# Patient Record
Sex: Male | Born: 1995 | Race: Black or African American | Hispanic: No | Marital: Single | Smoking: Never smoker
Health system: Southern US, Community
[De-identification: ages and names within clinical notes are randomized; demographics above are authoritative.]

## PROBLEM LIST (undated history)

## (undated) DIAGNOSIS — T7840XA Allergy, unspecified, initial encounter: Secondary | ICD-10-CM

## (undated) HISTORY — DX: Allergy, unspecified, initial encounter: T78.40XA

---
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
## 2017-12-05 ENCOUNTER — Ambulatory Visit: Payer: Managed Care, Other (non HMO) | Admitting: Family

## 2017-12-18 ENCOUNTER — Encounter: Payer: Self-pay | Admitting: Podiatry

## 2017-12-18 ENCOUNTER — Ambulatory Visit (INDEPENDENT_AMBULATORY_CARE_PROVIDER_SITE_OTHER): Payer: 59 | Admitting: Podiatry

## 2017-12-18 VITALS — BP 135/81 | HR 85

## 2017-12-18 DIAGNOSIS — Z79899 Other long term (current) drug therapy: Secondary | ICD-10-CM

## 2017-12-18 DIAGNOSIS — B351 Tinea unguium: Secondary | ICD-10-CM

## 2017-12-18 MED ORDER — TERBINAFINE HCL 250 MG PO TABS: 250.0000 mg | ORAL_TABLET | Freq: Every day | ORAL | 0 refills | Status: DC

## 2017-12-18 NOTE — Progress Notes (Signed)
  Subjective:  Patient ID: Wesley Gilmore, male    DOB: [birth year],  MRN: [id number]  Chief Complaint  Patient presents with  . Nail Problem    bilateral discolored nails   22 y.o. male presents with the above complaint. Reports bilateral discolored toenails present for quite some time.  Denies prior treatments. . Past Medical History:  Diagnosis Date  . Allergy   . Asthma    Childhood   History reviewed. No pertinent surgical history.  Current Outpatient Medications:  .  levocetirizine (XYZAL) 5 MG tablet, Take 1 tablet (5 mg total) by mouth every evening., Disp: 90 tablet, Rfl: 1 .  terbinafine (LAMISIL) 250 MG tablet, Take 1 tablet (250 mg total) by mouth daily., Disp: 90 tablet, Rfl: 0  No Known Allergies Review of Systems: Negative except as noted in the HPI. Denies N/V/F/Ch. Objective:   Vitals:   12/18/17 1629  BP: 135/81  Pulse: 85   General AA&O x3. Normal mood and affect.  Vascular Dorsalis pedis and posterior tibial pulses  present 2+ bilaterally  Capillary refill normal to all digits. Pedal hair growth normal.  Neurologic Epicritic sensation grossly present.  Dermatologic No open lesions. Interspaces clear of maceration. No bilateral discolored thickened brown discoloration  Orthopedic: MMT 5/5 in dorsiflexion, plantarflexion, inversion, and eversion. Normal joint ROM without pain or crepitus.   Assessment & Plan:  Patient was evaluated and treated and all questions answered.  Onychomycosis -Educated on etiology of nail fungus. -Nail sample taken for microbiology and histology. -Baseline liver function studies ordered. Will d/c terbinafine if elevated during therapy. -eRx for oral terbinafine #30. Educated on risks and benefits of the medication.  Return in about 5 weeks (around 01/22/2018) for Nail Fungus.l

## 2018-01-12 ENCOUNTER — Encounter: Payer: Self-pay | Admitting: Podiatry

## 2018-01-22 ENCOUNTER — Ambulatory Visit (INDEPENDENT_AMBULATORY_CARE_PROVIDER_SITE_OTHER): Payer: 59 | Admitting: Podiatry

## 2018-01-22 DIAGNOSIS — Z79899 Other long term (current) drug therapy: Secondary | ICD-10-CM | POA: Diagnosis not present

## 2018-01-22 DIAGNOSIS — B351 Tinea unguium: Secondary | ICD-10-CM

## 2018-01-22 DIAGNOSIS — R61 Generalized hyperhidrosis: Secondary | ICD-10-CM

## 2018-01-22 LAB — HEPATIC FUNCTION PANEL
AG Ratio: 1.6 (calc) (ref 1.0–2.5)
ALBUMIN MSPROF: 4.2 g/dL (ref 3.6–5.1)
ALKALINE PHOSPHATASE (APISO): 48 U/L (ref 48–230)
ALT: 33 U/L (ref 8–46)
AST: 23 U/L (ref 12–32)
BILIRUBIN TOTAL: 0.2 mg/dL (ref 0.2–1.1)
Bilirubin, Direct: 0.1 mg/dL (ref 0.0–0.2)
Globulin: 2.7 g/dL (calc) (ref 2.1–3.5)
Indirect Bilirubin: 0.1 mg/dL (calc) — ABNORMAL LOW (ref 0.2–1.1)
TOTAL PROTEIN: 6.9 g/dL (ref 6.3–8.2)

## 2018-01-22 MED ORDER — TERBINAFINE HCL 250 MG PO TABS: 250.0000 mg | ORAL_TABLET | Freq: Every day | ORAL | 0 refills | Status: DC

## 2018-01-22 MED ORDER — ALUMINUM CHLORIDE 20 % EX SOLN: Freq: Every day | CUTANEOUS | 0 refills | Status: DC

## 2018-02-13 NOTE — Progress Notes (Signed)
  Subjective:  Patient ID: Wesley Gilmore, male    DOB: 25-Sep-1995,  MRN: [id number]  Chief Complaint  Patient presents with  . Nail Problem    fungus follow up - doing better - has bilateral clear new growth   22 y.o. male returns for the above complaint.  Reports that the nail is looking better and that there is clear growth growing on the nails of both feet.  Also complains of sweating to his feet.  Objective:  There were no vitals filed for this visit. General AA&O x3. Normal mood and affect.  Vascular Pedal pulses palpable.  Neurologic Epicritic sensation grossly intact.  Dermatologic No open lesions. Skin normal texture and turgor.  Possible clearing of the nails noted  Orthopedic: No pain to palpation either foot.   Assessment & Plan:  Patient was evaluated and treated and all questions answered.  Onychomycosis -Continue Lamisil  -Reorder LFTs  Hyperhidrosis -Rx Drysol  Return in about 5 weeks (around 02/26/2018) for Nail Fungus.

## 2018-02-26 ENCOUNTER — Ambulatory Visit (INDEPENDENT_AMBULATORY_CARE_PROVIDER_SITE_OTHER): Payer: 59 | Admitting: Podiatry

## 2018-02-26 ENCOUNTER — Encounter: Payer: Self-pay | Admitting: Podiatry

## 2018-02-26 DIAGNOSIS — B351 Tinea unguium: Secondary | ICD-10-CM | POA: Diagnosis not present

## 2018-03-12 ENCOUNTER — Encounter: Payer: Self-pay | Admitting: Nurse Practitioner

## 2018-03-15 NOTE — Progress Notes (Signed)
  Subjective:  Patient ID: Verlon SettingKameron D Colgate, male    DOB: 1996/06/14,  MRN: [id number]  Chief Complaint  Patient presents with  . Nail Problem    follow up nail fungus; pt stated, "doing good, see big difference from last visit, no new concerns"   22 y.o. male returns for the above complaint.  States that he is doing very well noticing difference between last visit and this visit  Objective:  There were no vitals filed for this visit. General AA&O x3. Normal mood and affect.  Vascular Pedal pulses palpable.  Neurologic Epicritic sensation grossly intact.  Dermatologic No open lesions. Skin normal texture and turgor.  Nail fungus resolving proximal clearing noted  Orthopedic: No pain to palpation either foot.   Assessment & Plan:  Patient was evaluated and treated and all questions answered.  Nail fungus -Improving continue Lamisil to completion  Return if symptoms worsen or fail to improve.

## 2018-09-22 ENCOUNTER — Other Ambulatory Visit: Payer: Self-pay

## 2018-09-22 ENCOUNTER — Emergency Department (HOSPITAL_COMMUNITY)
Admission: EM | Admit: 2018-09-22 | Discharge: 2018-09-23 | Disposition: A | Discharge status: Home / Self Care | Payer: 59 | Attending: Emergency Medicine | Admitting: Emergency Medicine

## 2018-09-22 ENCOUNTER — Encounter (HOSPITAL_COMMUNITY): Payer: Self-pay | Admitting: *Deleted

## 2018-09-22 DIAGNOSIS — M7918 Myalgia, other site: Secondary | ICD-10-CM | POA: Insufficient documentation

## 2018-09-22 DIAGNOSIS — J029 Acute pharyngitis, unspecified: Secondary | ICD-10-CM | POA: Insufficient documentation

## 2018-09-22 DIAGNOSIS — B349 Viral infection, unspecified: Secondary | ICD-10-CM

## 2018-09-22 DIAGNOSIS — R509 Fever, unspecified: Secondary | ICD-10-CM | POA: Diagnosis present

## 2018-09-22 MED ORDER — IBUPROFEN 800 MG PO TABS
800.0000 mg | ORAL_TABLET | Freq: Once | ORAL | Status: AC
  Administered 2018-09-22: 800 mg via ORAL
  Filled 2018-09-22: qty 1

## 2018-09-22 MED ORDER — SODIUM CHLORIDE 0.9 % IV BOLUS
2000.0000 mL | Freq: Once | INTRAVENOUS | Status: AC
  Administered 2018-09-22: 2000 mL via INTRAVENOUS

## 2018-09-22 MED ORDER — ACETAMINOPHEN 325 MG PO TABS
650.0000 mg | ORAL_TABLET | Freq: Once | ORAL | Status: AC | PRN
  Administered 2018-09-22: 650 mg via ORAL
  Filled 2018-09-22: qty 2

## 2018-09-22 NOTE — ED Provider Notes (Signed)
[facility] EMERGENCY DEPARTMENT Provider Note   CSN: [id number] Arrival date & time: 09/22/18  1913     History   Chief Complaint Chief Complaint  Patient presents with  . Influenza    HPI Wesley Gilmore is a 23 y.o. male.  HPI Patient presents to the emergency department with sore throat and body aches with fever over the last 24 hours.  Patient states that he did not take any medications prior to arrival for symptoms.  Patient states that he has not had any nausea, vomiting, weakness, dizziness, blurred vision chest pain, shortness of breath, cough, diarrhea, or syncope. Past Medical History:  Diagnosis Date  . Allergy   . Asthma    Childhood    Patient Active Problem List   Diagnosis Date Noted  . Onychomycosis 04/21/2015  . Seasonal allergies 04/21/2015    History reviewed. No pertinent surgical history.      Home Medications    Prior to Admission medications   Medication Sig Start Date End Date Taking? Authorizing Provider  aluminum chloride (DRYSOL) 20 % external solution Apply topically at bedtime. 01/22/18   Park Liter, DPM  levocetirizine (XYZAL) 5 MG tablet Take 1 tablet (5 mg total) by mouth every evening. 04/21/15   Veryl Speak, FNP  terbinafine (LAMISIL) 250 MG tablet Take 1 tablet (250 mg total) by mouth daily. 01/22/18   Park Liter, DPM    Family History Family History  Problem Relation Age of Onset  . Celiac disease Mother   . Hypertension Maternal Grandmother   . Hypertension Maternal Aunt     Social History Social History   Tobacco Use  . Smoking status: Never Smoker  . Smokeless tobacco: Never Used  Substance Use Topics  . Alcohol use: No  . Drug use: No     Allergies   Patient has no known allergies.   Review of Systems Review of Systems All other systems negative except as documented in the HPI. All pertinent positives and negatives as reviewed in the HPI.  Physical Exam Updated Vital  Signs BP 135/82 (BP Location: Right Arm)   Pulse (!) 123   Temp (!) 101.2 F (38.4 C) (Oral)   Resp 17   Ht 6\' 3"  (1.905 m)   Wt 99.8 kg   SpO2 98%   BMI 27.50 kg/m   Physical Exam Vitals signs and nursing note reviewed.  Constitutional:      General: He is not in acute distress.    Appearance: He is well-developed.  HENT:     Head: Normocephalic and atraumatic.     Mouth/Throat:     Pharynx: Posterior oropharyngeal erythema present.  Eyes:     Pupils: Pupils are equal, round, and reactive to light.  Neck:     Musculoskeletal: Normal range of motion and neck supple.  Cardiovascular:     Rate and Rhythm: Normal rate and regular rhythm.     Heart sounds: Normal heart sounds. No murmur. No friction rub. No gallop.   Pulmonary:     Effort: Pulmonary effort is normal. No respiratory distress.     Breath sounds: Normal breath sounds. No wheezing.  Abdominal:     General: Bowel sounds are normal. There is no distension.     Palpations: Abdomen is soft.     Tenderness: There is no abdominal tenderness.  Skin:    General: Skin is warm and dry.     Capillary Refill: Capillary refill takes less than  2 seconds.     Findings: No erythema or rash.  Neurological:     Mental Status: He is alert and oriented to person, place, and time.     Motor: No abnormal muscle tone.     Coordination: Coordination normal.  Psychiatric:        Behavior: Behavior normal.      ED Treatments / Results  Labs (all labs ordered are listed, but only abnormal results are displayed) Labs Reviewed  GROUP A STREP BY PCR    EKG None  Radiology No results found.  Procedures Procedures (including critical care time)  Medications Ordered in ED Medications  sodium chloride 0.9 % bolus 2,000 mL (has no administration in time range)  acetaminophen (TYLENOL) tablet 650 mg (650 mg Oral Given 09/22/18 2044)  ibuprofen (ADVIL,MOTRIN) tablet 800 mg (800 mg Oral Given 09/22/18 2330)     Initial  Impression / Assessment and Plan / ED Course  I have reviewed the triage vital signs and the nursing notes.  Pertinent labs & imaging results that were available during my care of the patient were reviewed by me and considered in my medical decision making (see chart for details).     Be checked for strep pharyngitis.  Due to the fact that this is main symptom along with the fever.  Patient is advised to return here as needed.  Patient is given IV fluids along with antipyretics  Final Clinical Impressions(s) / ED Diagnoses   Final diagnoses:  None    ED Discharge Orders    None       Kyra MangesLawyer, Aricela Bertagnolli, PA-C 09/22/18 2340    Gerhard MunchLockwood, Robert, MD 09/25/18 1918

## 2018-09-22 NOTE — ED Triage Notes (Signed)
Pt c/o numbness and pain to bilateral arms. Per family, pt reports pt has had generalized bodyaches and headache since yesterday. Last took Mucinex at 1500; has not taken tylenol

## 2018-09-23 LAB — GROUP A STREP BY PCR: Group A Strep by PCR: NOT DETECTED

## 2018-09-23 MED ORDER — IBUPROFEN 800 MG PO TABS: 800.0000 mg | ORAL_TABLET | Freq: Three times a day (TID) | ORAL | 0 refills | Status: DC | PRN

## 2018-09-23 MED ORDER — ACETAMINOPHEN-CODEINE 120-12 MG/5ML PO SOLN: 10.0000 mL | ORAL | 0 refills | Status: DC | PRN

## 2018-09-23 NOTE — Discharge Instructions (Signed)
REturn here as needed. INcrease your fluid intake. Rest as much as possible.

## 2019-11-02 DIAGNOSIS — L03012 Cellulitis of left finger: Secondary | ICD-10-CM | POA: Diagnosis not present

## 2019-12-28 DIAGNOSIS — Z03818 Encounter for observation for suspected exposure to other biological agents ruled out: Secondary | ICD-10-CM | POA: Diagnosis not present

## 2019-12-28 DIAGNOSIS — Z20828 Contact with and (suspected) exposure to other viral communicable diseases: Secondary | ICD-10-CM | POA: Diagnosis not present

## 2019-12-28 DIAGNOSIS — U071 COVID-19: Secondary | ICD-10-CM | POA: Diagnosis not present

## 2020-09-20 ENCOUNTER — Other Ambulatory Visit: Payer: 59

## 2020-10-24 ENCOUNTER — Telehealth: Payer: Self-pay | Admitting: *Deleted

## 2020-10-24 NOTE — Telephone Encounter (Signed)
Yes I can see him  ?

## 2020-10-24 NOTE — Telephone Encounter (Signed)
Rec'd call from Mrs. Wesley Gilmore she states her grandson has a bump on his eye that been there for over a month. Pt use to she Wesley Gilmore, but want to know will Dr. Lawerance Bach take him on as a new pt. Inform grandmother will have to send msg to MD and we will call her back w/her response.Marland KitchenRaechel Chute

## 2020-11-07 ENCOUNTER — Ambulatory Visit: Payer: 59 | Admitting: Internal Medicine

## 2020-11-16 ENCOUNTER — Other Ambulatory Visit: Payer: Self-pay

## 2020-11-16 ENCOUNTER — Ambulatory Visit (INDEPENDENT_AMBULATORY_CARE_PROVIDER_SITE_OTHER): Payer: 59 | Admitting: Internal Medicine

## 2020-11-16 ENCOUNTER — Encounter: Payer: Self-pay | Admitting: Internal Medicine

## 2020-11-16 VITALS — BP 132/82 | HR 97 | Temp 98.1°F | Ht 74.0 in | Wt 275.0 lb

## 2020-11-16 DIAGNOSIS — Z0000 Encounter for general adult medical examination without abnormal findings: Secondary | ICD-10-CM

## 2020-11-16 NOTE — Progress Notes (Signed)
Subjective:    Patient ID: Wesley Gilmore, male    DOB: Apr 10, 1996, 25 y.o.   MRN: [id number]  HPI  He is here to establish with a new pcp.   He is here for a physical exam.   He had a bump on his left eye lid - it did go away.    He has no concerns.  He needs to establish with a new pcp.   Medications and allergies reviewed with patient and updated if appropriate.  Patient Active Problem List   Diagnosis Date Noted  . Onychomycosis 04/21/2015  . Seasonal allergies 04/21/2015    Current Outpatient Medications on File Prior to Visit  Medication Sig Dispense Refill  . aluminum chloride (DRYSOL) 20 % external solution Apply topically at bedtime. 35 mL 0   No current facility-administered medications on file prior to visit.    Past Medical History:  Diagnosis Date  . Allergy   . Asthma    Childhood    History reviewed. No pertinent surgical history.  Social History   Socioeconomic History  . Marital status: Single    Spouse name: Not on file  . Number of children: 0  . Years of education: 70  . Highest education level: Not on file  Occupational History  . Occupation: Consulting civil engineer  Tobacco Use  . Smoking status: Never Smoker  . Smokeless tobacco: Never Used  Substance and Sexual Activity  . Alcohol use: No  . Drug use: No  . Sexual activity: Not on file  Other Topics Concern  . Not on file  Social History Narrative   Fun: Play sports   Denies religious beliefs effecting health care.    Social Determinants of Health   Financial Resource Strain: Not on file  Food Insecurity: Not on file  Transportation Needs: Not on file  Physical Activity: Not on file  Stress: Not on file  Social Connections: Not on file    Family History  Problem Relation Age of Onset  . Celiac disease Mother   . Hypertension Maternal Grandmother   . Hypertension Maternal Aunt     Review of Systems  Constitutional: Negative for chills and fever.  Eyes: Negative for visual  disturbance.  Respiratory: Negative for cough, shortness of breath and wheezing.   Cardiovascular: Negative for chest pain, palpitations and leg swelling.  Gastrointestinal: Negative for abdominal pain, blood in stool, constipation, diarrhea and nausea.  Genitourinary: Negative for dysuria.  Musculoskeletal: Positive for back pain (intermittent, from work).  Skin: Negative for rash.  Neurological: Negative for light-headedness and headaches.  Psychiatric/Behavioral: Negative for dysphoric mood. The patient is not nervous/anxious.        Objective:   Vitals:   11/16/20 1000  BP: 132/82  Pulse: 97  Temp: 98.1 F (36.7 C)  SpO2: 98%   Filed Weights   11/16/20 1000  Weight: 275 lb (124.7 kg)   Body mass index is 35.31 kg/m.  BP Readings from Last 3 Encounters:  11/16/20 132/82  09/22/18 135/82  12/18/17 135/81    Wt Readings from Last 3 Encounters:  11/16/20 275 lb (124.7 kg)  09/22/18 220 lb (99.8 kg)  04/21/15 220 lb (99.8 kg) (98 %, Z= 2.12)*   * Growth percentiles are based on CDC (Boys, 2-20 Years) data.     Physical Exam Constitutional: He appears well-developed and well-nourished. No distress.  HENT:  Head: Normocephalic and atraumatic.  Right Ear: External ear normal.  Left Ear: External ear normal.  Mouth/Throat:  Oropharynx is clear and moist.  Normal ear canals and TM b/l  Eyes: Conjunctivae and EOM are normal.  Neck: Neck supple. No tracheal deviation present. No thyromegaly present.  No carotid bruit  Cardiovascular: Normal rate, regular rhythm, normal heart sounds and intact distal pulses.   No murmur heard. Pulmonary/Chest: Effort normal and breath sounds normal. No respiratory distress. He has no wheezes. He has no rales.  Abdominal: Soft. He exhibits no distension. There is no tenderness.  Genitourinary: deferred  Musculoskeletal: He exhibits no edema.  Lymphadenopathy:   He has no cervical adenopathy.  Skin: Skin is warm and dry. He is not  diaphoretic.  Psychiatric: He has a normal mood and affect. His behavior is normal.         Assessment & Plan:   Physical exam: Screening blood work  deferred Immunizations  ? Last tdap - likely w/in last 10 years - deferred.  Deferred flu Exercise   Work is his exercise Weight  Overweight  Substance abuse   none  See Problem List for Assessment and Plan of chronic medical problems.   This visit occurred during the SARS-CoV-2 public health emergency.  Safety protocols were in place, including screening questions prior to the visit, additional usage of staff PPE, and extensive cleaning of exam room while observing appropriate contact time as indicated for disinfecting solutions.

## 2020-11-16 NOTE — Patient Instructions (Addendum)
It was nice to meet you.     Medications changes include :   none      Health Maintenance, Male Adopting a healthy lifestyle and getting preventive care are important in promoting health and wellness. Ask your health care provider about:  The right schedule for you to have regular tests and exams.  Things you can do on your own to prevent diseases and keep yourself healthy. What should I know about diet, weight, and exercise? Eat a healthy diet  Eat a diet that includes plenty of vegetables, fruits, low-fat dairy products, and lean protein.  Do not eat a lot of foods that are high in solid fats, added sugars, or sodium.   Maintain a healthy weight Body mass index (BMI) is a measurement that can be used to identify possible weight problems. It estimates body fat based on height and weight. Your health care provider can help determine your BMI and help you achieve or maintain a healthy weight. Get regular exercise Get regular exercise. This is one of the most important things you can do for your health. Most adults should:  Exercise for at least 150 minutes each week. The exercise should increase your heart rate and make you sweat (moderate-intensity exercise).  Do strengthening exercises at least twice a week. This is in addition to the moderate-intensity exercise.  Spend less time sitting. Even light physical activity can be beneficial. Watch cholesterol and blood lipids Have your blood tested for lipids and cholesterol at 23 years of age, then have this test every 5 years. You may need to have your cholesterol levels checked more often if:  Your lipid or cholesterol levels are high.  You are older than 23 years of age.  You are at high risk for heart disease. What should I know about cancer screening? Many types of cancers can be detected early and may often be prevented. Depending on your health history and family history, you may need to have cancer screening at various  ages. This may include screening for:  Colorectal cancer.  Prostate cancer.  Skin cancer.  Lung cancer. What should I know about heart disease, diabetes, and high blood pressure? Blood pressure and heart disease  High blood pressure causes heart disease and increases the risk of stroke. This is more likely to develop in people who have high blood pressure readings, are of African descent, or are overweight.  Talk with your health care provider about your target blood pressure readings.  Have your blood pressure checked: ? Every 3-5 years if you are 57-33 years of age. ? Every year if you are 87 years old or older.  If you are between the ages of 36 and 40 and are a current or former smoker, ask your health care provider if you should have a one-time screening for abdominal aortic aneurysm (AAA). Diabetes Have regular diabetes screenings. This checks your fasting blood sugar level. Have the screening done:  Once every three years after age 19 if you are at a normal weight and have a low risk for diabetes.  More often and at a younger age if you are overweight or have a high risk for diabetes. What should I know about preventing infection? Hepatitis B If you have a higher risk for hepatitis B, you should be screened for this virus. Talk with your health care provider to find out if you are at risk for hepatitis B infection. Hepatitis C Blood testing is recommended for:  Everyone born from  1945 through [birth year].  Anyone with known risk factors for hepatitis C. Sexually transmitted infections (STIs)  You should be screened each year for STIs, including gonorrhea and chlamydia, if: ? You are sexually active and are younger than 23 years of age. ? You are older than 23 years of age and your health care provider tells you that you are at risk for this type of infection. ? Your sexual activity has changed since you were last screened, and you are at increased risk for chlamydia or  gonorrhea. Ask your health care provider if you are at risk.  Ask your health care provider about whether you are at high risk for HIV. Your health care provider may recommend a prescription medicine to help prevent HIV infection. If you choose to take medicine to prevent HIV, you should first get tested for HIV. You should then be tested every 3 months for as long as you are taking the medicine. Follow these instructions at home: Lifestyle  Do not use any products that contain nicotine or tobacco, such as cigarettes, e-cigarettes, and chewing tobacco. If you need help quitting, ask your health care provider.  Do not use street drugs.  Do not share needles.  Ask your health care provider for help if you need support or information about quitting drugs. Alcohol use  Do not drink alcohol if your health care provider tells you not to drink.  If you drink alcohol: ? Limit how much you have to 0-2 drinks a day. ? Be aware of how much alcohol is in your drink. In the [place] one drink equals one 12 oz bottle of beer (355 mL), one 5 oz glass of wine (148 mL), or one 1 oz glass of hard liquor (44 mL). General instructions  Schedule regular health, dental, and eye exams.  Stay current with your vaccines.  Tell your health care provider if: ? You often feel depressed. ? You have ever been abused or do not feel safe at home. Summary  Adopting a healthy lifestyle and getting preventive care are important in promoting health and wellness.  Follow your health care provider's instructions about healthy diet, exercising, and getting tested or screened for diseases.  Follow your health care provider's instructions on monitoring your cholesterol and blood pressure. This information is not intended to replace advice given to you by your health care provider. Make sure you discuss any questions you have with your health care provider. Document Revised: 08/26/2018 Document Reviewed:  08/26/2018 Elsevier Patient Education  2021 [employer].

## 2021-04-18 ENCOUNTER — Encounter (HOSPITAL_COMMUNITY): Payer: Self-pay

## 2021-04-18 ENCOUNTER — Other Ambulatory Visit: Payer: Self-pay

## 2021-04-18 ENCOUNTER — Ambulatory Visit (HOSPITAL_COMMUNITY)
Admission: EM | Admit: 2021-04-18 | Discharge: 2021-04-18 | Disposition: A | Discharge status: Home / Self Care | Payer: 59 | Attending: Internal Medicine | Admitting: Internal Medicine

## 2021-04-18 DIAGNOSIS — M545 Low back pain, unspecified: Secondary | ICD-10-CM

## 2021-04-18 MED ORDER — KETOROLAC TROMETHAMINE 30 MG/ML IJ SOLN
INTRAMUSCULAR | Status: AC
  Filled 2021-04-18: qty 1

## 2021-04-18 MED ORDER — KETOROLAC TROMETHAMINE 60 MG/2ML IM SOLN
30.0000 mg | Freq: Once | INTRAMUSCULAR | Status: AC
  Administered 2021-04-18: 30 mg via INTRAMUSCULAR

## 2021-04-18 NOTE — ED Triage Notes (Signed)
Pt reports on and off right sided lower back pain x 2 days. Worse when walking.

## 2021-04-18 NOTE — Discharge Instructions (Addendum)
You were given ketorolac injection in urgent care today for pain and inflammation.  Please avoid taking any over-the-counter ibuprofen, Advil, Aleve for at least 24 hours following injection.  After 24 hours, you may take ibuprofen, Advil, Aleve and you may alternate this with Tylenol as needed for pain.  Please alternate ice and heat application as well.  Follow-up with primary care if pain does not resolve.

## 2021-04-18 NOTE — ED Provider Notes (Signed)
[facility]    CSN: [id number] Arrival date & time: 04/18/21  8250      History   Chief Complaint Chief Complaint  Patient presents with  . Back Pain    HPI Wesley Gilmore is a 25 y.o. male.   Patient presents with right-sided lower back pain that has been present for approximately 2 days.  Denies any known injury to back.  Denies any history of back pain.  Pain is worsened with any type of movement and does not radiate down either one of his legs.  Denies any urinary burning, frequency, hematuria, urinary or bowel incontinence.  Has taken Tylenol with some relief of pain.   Back Pain  Past Medical History:  Diagnosis Date  . Allergy   . Asthma    Childhood    Patient Active Problem List   Diagnosis Date Noted  . Onychomycosis 04/21/2015  . Seasonal allergies 04/21/2015    History reviewed. No pertinent surgical history.     Home Medications    Prior to Admission medications   Medication Sig Start Date End Date Taking? Authorizing Provider  aluminum chloride (DRYSOL) 20 % external solution Apply topically at bedtime. 01/22/18   Park Liter, DPM    Family History Family History  Problem Relation Age of Onset  . Celiac disease Mother   . Hypertension Maternal Grandmother   . Hypertension Maternal Aunt     Social History Social History   Tobacco Use  . Smoking status: Never  . Smokeless tobacco: Never  Substance Use Topics  . Alcohol use: No  . Drug use: No     Allergies   Patient has no known allergies.   Review of Systems Review of Systems  Musculoskeletal:  Positive for back pain.  Per HPI  Physical Exam Triage Vital Signs ED Triage Vitals  Enc Vitals Group     BP 04/18/21 0959 (!) 143/82     Pulse Rate 04/18/21 0959 71     Resp 04/18/21 0959 18     Temp 04/18/21 0959 99.3 F (37.4 C)     Temp Source 04/18/21 0959 Oral     SpO2 04/18/21 0959 98 %     Weight --      Height --      Head Circumference --       Peak Flow --      Pain Score 04/18/21 0957 8     Pain Loc --      Pain Edu? --      Excl. in GC? --    No data found.  Updated Vital Signs BP (!) 143/82 (BP Location: Left Arm)   Pulse 71   Temp 99.3 F (37.4 C) (Oral)   Resp 18   SpO2 98%   Visual Acuity Right Eye Distance:   Left Eye Distance:   Bilateral Distance:    Right Eye Near:   Left Eye Near:    Bilateral Near:     Physical Exam Constitutional:      Appearance: Normal appearance.  HENT:     Head: Normocephalic and atraumatic.  Eyes:     Extraocular Movements: Extraocular movements intact.     Conjunctiva/sclera: Conjunctivae normal.  Pulmonary:     Effort: Pulmonary effort is normal.  Musculoskeletal:     Cervical back: Normal.     Thoracic back: Normal.     Lumbar back: Normal. No tenderness or bony tenderness. Normal range of motion.     Comments:  No tenderness to palpation throughout back.  Patient states that pain occurs with movement.  Neurological:     General: No focal deficit present.     Mental Status: He is alert and oriented to person, place, and time. Mental status is at baseline.     Deep Tendon Reflexes: Reflexes are normal and symmetric.     Comments: Neurovascular intact.  Psychiatric:        Mood and Affect: Mood normal.        Behavior: Behavior normal.        Thought Content: Thought content normal.        Judgment: Judgment normal.     UC Treatments / Results  Labs (all labs ordered are listed, but only abnormal results are displayed) Labs Reviewed - No data to display  EKG   Radiology No results found.  Procedures Procedures (including critical care time)  Medications Ordered in UC Medications  ketorolac (TORADOL) injection 30 mg (30 mg Intramuscular Given 04/18/21 1026)    Initial Impression / Assessment and Plan / UC Course  I have reviewed the triage vital signs and the nursing notes.  Pertinent labs & imaging results that were available during my care of the  patient were reviewed by me and considered in my medical decision making (see chart for details).     Ketorolac injection administered in urgent care today for relief of pain and inflammation.  Advised patient to not take any over-the-counter NSAIDs for at least 24 hours following injection.  After 24 hours, patient may take over-the-counter NSAIDs as needed for pain and inflammation and may alternate with Tylenol.  Patient to alternate ice and heat application.  Patient to follow-up with PCP if pain does not resolve in the next 1 to 2 weeks.  Low suspicion for kidney stone given mechanism of pain but advised patient of possibility and to go to the hospital if pain significantly worsens.  Discussed strict return precautions. Patient verbalized understanding and is agreeable with plan.  Final Clinical Impressions(s) / UC Diagnoses   Final diagnoses:  Acute right-sided low back pain without sciatica     Discharge Instructions      You were given ketorolac injection in urgent care today for pain and inflammation.  Please avoid taking any over-the-counter ibuprofen, Advil, Aleve for at least 24 hours following injection.  After 24 hours, you may take ibuprofen, Advil, Aleve and you may alternate this with Tylenol as needed for pain.  Please alternate ice and heat application as well.  Follow-up with primary care if pain does not resolve.     ED Prescriptions   None    PDMP not reviewed this encounter.   Lance Muss, FNP 04/18/21 1032

## 2021-04-20 ENCOUNTER — Telehealth (HOSPITAL_COMMUNITY): Payer: Self-pay

## 2021-05-14 ENCOUNTER — Encounter (HOSPITAL_COMMUNITY): Payer: Self-pay

## 2021-05-14 ENCOUNTER — Other Ambulatory Visit: Payer: Self-pay

## 2021-05-14 ENCOUNTER — Ambulatory Visit (HOSPITAL_COMMUNITY)
Admission: EM | Admit: 2021-05-14 | Discharge: 2021-05-14 | Disposition: A | Discharge status: Home / Self Care | Payer: 59 | Attending: Internal Medicine | Admitting: Internal Medicine

## 2021-05-14 DIAGNOSIS — M545 Low back pain, unspecified: Secondary | ICD-10-CM

## 2021-05-14 MED ORDER — NAPROXEN 375 MG PO TABS: 375.0000 mg | ORAL_TABLET | Freq: Two times a day (BID) | ORAL | 0 refills | Status: DC | PRN

## 2021-05-14 MED ORDER — KETOROLAC TROMETHAMINE 30 MG/ML IJ SOLN
30.0000 mg | Freq: Once | INTRAMUSCULAR | Status: AC
  Administered 2021-05-14: 30 mg via INTRAMUSCULAR

## 2021-05-14 MED ORDER — METHOCARBAMOL 500 MG PO TABS: 500.0000 mg | ORAL_TABLET | Freq: Every evening | ORAL | 0 refills | Status: DC | PRN

## 2021-05-14 MED ORDER — KETOROLAC TROMETHAMINE 30 MG/ML IJ SOLN
INTRAMUSCULAR | Status: AC
  Filled 2021-05-14: qty 1

## 2021-05-14 NOTE — ED Provider Notes (Signed)
[facility]    CSN: [id number] Arrival date & time: 05/14/21  1303      History   Chief Complaint Chief Complaint  Patient presents with   Back Pain    HPI Wesley Gilmore is a 25 y.o. male comes to the urgent care with a 1 day history of sharp low back pain.  Pain is currently 8 out of 10.  Patient denies any fall or trauma to the back.  No radiation of pain.  Pain is associated with tightness in the lower back.  No weakness in the lower extremities.  Patient was seen a couple of months ago for similar symptoms.  He works as a Estate agent.   HPI  Past Medical History:  Diagnosis Date   Allergy    Asthma    Childhood    Patient Active Problem List   Diagnosis Date Noted   Onychomycosis 04/21/2015   Seasonal allergies 04/21/2015    History reviewed. No pertinent surgical history.     Home Medications    Prior to Admission medications   Medication Sig Start Date End Date Taking? Authorizing Provider  methocarbamol (ROBAXIN) 500 MG tablet Take 1 tablet (500 mg total) by mouth at bedtime as needed for muscle spasms. 05/14/21  Yes Laurali Goddard, Britta Mccreedy, MD  naproxen (NAPROSYN) 375 MG tablet Take 1 tablet (375 mg total) by mouth 2 (two) times daily as needed. 05/14/21  Yes Krystian Ferrentino, Britta Mccreedy, MD  aluminum chloride (DRYSOL) 20 % external solution Apply topically at bedtime. 01/22/18   Park Liter, DPM    Family History Family History  Problem Relation Age of Onset   Celiac disease Mother    Hypertension Maternal Grandmother    Hypertension Maternal Aunt     Social History Social History   Tobacco Use   Smoking status: Never   Smokeless tobacco: Never  Substance Use Topics   Alcohol use: No   Drug use: No     Allergies   Patient has no known allergies.   Review of Systems Review of Systems  HENT: Negative.    Gastrointestinal: Negative.   Musculoskeletal:  Positive for arthralgias and back pain. Negative for gait problem, joint  swelling, neck pain and neck stiffness.    Physical Exam Triage Vital Signs ED Triage Vitals  Enc Vitals Group     BP 05/14/21 1404 138/89     Pulse Rate 05/14/21 1404 73     Resp 05/14/21 1404 20     Temp 05/14/21 1404 98.8 F (37.1 C)     Temp Source 05/14/21 1404 Oral     SpO2 05/14/21 1404 96 %     Weight --      Height --      Head Circumference --      Peak Flow --      Pain Score 05/14/21 1403 8     Pain Loc --      Pain Edu? --      Excl. in GC? --    No data found.  Updated Vital Signs BP 138/89 (BP Location: Left Arm)   Pulse 73   Temp 98.8 F (37.1 C) (Oral)   Resp 20   SpO2 96%   Visual Acuity Right Eye Distance:   Left Eye Distance:   Bilateral Distance:    Right Eye Near:   Left Eye Near:    Bilateral Near:     Physical Exam Vitals and nursing note reviewed.  Constitutional:  General: He is not in acute distress.    Appearance: He is not ill-appearing.  Cardiovascular:     Rate and Rhythm: Normal rate and regular rhythm.  Abdominal:     General: Bowel sounds are normal.     Palpations: Abdomen is soft.  Musculoskeletal:        General: No swelling, tenderness, deformity or signs of injury. Normal range of motion.  Skin:    General: Skin is warm.     Capillary Refill: Capillary refill takes less than 2 seconds.  Neurological:     General: No focal deficit present.     Mental Status: He is alert and oriented to person, place, and time. Mental status is at baseline.     UC Treatments / Results  Labs (all labs ordered are listed, but only abnormal results are displayed) Labs Reviewed - No data to display  EKG   Radiology No results found.  Procedures Procedures (including critical care time)  Medications Ordered in UC Medications  ketorolac (TORADOL) 30 MG/ML injection 30 mg (has no administration in time range)    Initial Impression / Assessment and Plan / UC Course  I have reviewed the triage vital signs and the  nursing notes.  Pertinent labs & imaging results that were available during my care of the patient were reviewed by me and considered in my medical decision making (see chart for details).     1.  Acute bilateral low back pain without sciatica: Robaxin 500 mg at bedtime as needed Naproxen 375 mg twice daily as needed Gentle range of motion exercises Heating pad will help with back pain. Return precautions given Final Clinical Impressions(s) / UC Diagnoses   Final diagnoses:  Acute bilateral low back pain without sciatica   Discharge Instructions   None    ED Prescriptions     Medication Sig Dispense Auth. Provider   methocarbamol (ROBAXIN) 500 MG tablet Take 1 tablet (500 mg total) by mouth at bedtime as needed for muscle spasms. 20 tablet Kortland Nichols, Britta Mccreedy, MD   naproxen (NAPROSYN) 375 MG tablet Take 1 tablet (375 mg total) by mouth 2 (two) times daily as needed. 20 tablet [place], Britta Mccreedy, MD      PDMP not reviewed this encounter.   Merrilee Jansky, MD 05/14/21 [id number]

## 2021-05-14 NOTE — ED Triage Notes (Signed)
Pt presents with ongoing lower back pain and bilateral hip pain for about 3 weeks with no known injury.

## 2021-05-22 ENCOUNTER — Other Ambulatory Visit: Payer: Self-pay

## 2021-05-22 ENCOUNTER — Ambulatory Visit: Payer: 59 | Admitting: Sports Medicine

## 2021-05-22 VITALS — BP 136/82 | HR 80 | Ht 74.0 in | Wt 249.0 lb

## 2021-05-22 DIAGNOSIS — M545 Low back pain, unspecified: Secondary | ICD-10-CM

## 2021-05-22 DIAGNOSIS — M9902 Segmental and somatic dysfunction of thoracic region: Secondary | ICD-10-CM

## 2021-05-22 DIAGNOSIS — M9903 Segmental and somatic dysfunction of lumbar region: Secondary | ICD-10-CM | POA: Diagnosis not present

## 2021-05-22 DIAGNOSIS — M9905 Segmental and somatic dysfunction of pelvic region: Secondary | ICD-10-CM

## 2021-05-22 MED ORDER — MELOXICAM 15 MG PO TABS: 15.0000 mg | ORAL_TABLET | Freq: Every day | ORAL | 0 refills | Status: DC

## 2021-05-22 NOTE — Progress Notes (Signed)
Wesley Gilmore [name] [address] [place] [address] Phone: [phone]   Assessment and Plan:     1. Acute right-sided low back pain without sciatica 2. Somatic dysfunction of pelvic region 3. Somatic dysfunction of lumbar region 4. Somatic dysfunction of thoracic region -Acute, unchanged, initial sports medicine visit - Likely acute strain of lumbar right-sided paraspinals with reaggravation with prolonged sitting at his work - Start meloxicam 15 mg daily x2 weeks then may use remaining naproxen from urgent care as needed for pain control - Start HEP focused on low back and right gluteal - Activity as tolerated   Decision today to treat with OMT was based on Physical Exam   After verbal consent patient was treated with HVLA, ME, ST, FPR techniques in cervical, rib, thoracic, lumbar, and pelvic areas. Patient tolerated the procedure well with improvement in symptoms.  Patient educated on potential side effects of soreness and recommended to rest, hydrate, and use Tylenol as needed for pain control.   All pertinent previous records reviewed prior to and during visit.    Follow Up: In 4 to 6 weeks if no improvement or worsening of symptoms.  Could consider repeat OMT versus imaging   Subjective:    Chief Complaint: Right low back pain  HPI:   05/22/21  Patient states that pain in R side back and hip has been going on for a month. 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 Patient locates pain to R side of hip/leg and lower right sided pain that will radiate to lower left. No numbness, tingling, or weakness. Patient did go to urgent care for this pain and was given Robaxin which helped at night but the pain has still remained. Patient uses, Ice and heat to help with the pain. Pain is worse in the morning when getting up and dissipates throughout the day.    Relevant Historical Information: History of similar mild low back pain that  typically resolves in <1-week  Additional pertinent review of systems negative.  Current Outpatient Medications  Medication Sig Dispense Refill   aluminum chloride (DRYSOL) 20 % external solution Apply topically at bedtime. 35 mL 0   meloxicam (MOBIC) 15 MG tablet Take 1 tablet (15 mg total) by mouth daily. 14 tablet 0   methocarbamol (ROBAXIN) 500 MG tablet Take 1 tablet (500 mg total) by mouth at bedtime as needed for muscle spasms. 20 tablet 0   naproxen (NAPROSYN) 375 MG tablet Take 1 tablet (375 mg total) by mouth 2 (two) times daily as needed. 20 tablet 0   No current facility-administered medications for this visit.      Objective:     Vitals:   05/22/21 1015  BP: 136/82  Pulse: 80  SpO2: 97%  Weight: 249 lb (112.9 kg)  Height: 6\' 2"  (1.88 m)      Body mass index is 31.97 kg/m.    Physical Exam:    Gen: Appears well, nad, nontoxic and pleasant Psych: Alert and oriented, appropriate mood and affect Neuro: sensation intact, strength is 5/5 in upper and lower extremities, muscle tone wnl Skin: no susupicious lesions or rashes  Back - Normal skin, Spine with normal alignment and no deformity.   No tenderness to vertebral process palpation.   Straight leg raise negative Trendelenberg + right  OMT Physical Exam:  ASIS Compression Test: Positive Right Thoracic: TTP paraspinal mildly bilaterally.  RRSL T4-6 Lumbar: TTT paraspinal, worse on right compared to  left.  RRSL L2-4 Pelvis: Right anterior innominate   OMT abbreviations: HVLA (high velocity low amplitude), ME (muscle energy), FPR (flex positional release), ST (soft tissue), TTP (tender to palpation),  Electronically signed by:  Wesley Gilmore [name] 10:53 AM 05/22/21

## 2021-05-22 NOTE — Patient Instructions (Addendum)
Good to see you  Meloxicam 15mg  daily every morning 2 weeks Home exercises for low back and glute See me again in 4 weeks if no better

## 2021-12-12 DIAGNOSIS — H5213 Myopia, bilateral: Secondary | ICD-10-CM | POA: Diagnosis not present

## 2021-12-12 DIAGNOSIS — Z0100 Encounter for examination of eyes and vision without abnormal findings: Secondary | ICD-10-CM | POA: Diagnosis not present

## 2022-11-06 ENCOUNTER — Encounter: Payer: Self-pay | Admitting: Family Medicine

## 2022-11-06 ENCOUNTER — Ambulatory Visit (INDEPENDENT_AMBULATORY_CARE_PROVIDER_SITE_OTHER): Payer: 59 | Admitting: Family Medicine

## 2022-11-06 VITALS — BP 130/84 | HR 70 | Temp 98.2°F | Ht 74.0 in | Wt 263.6 lb

## 2022-11-06 DIAGNOSIS — H02843 Edema of right eye, unspecified eyelid: Secondary | ICD-10-CM | POA: Diagnosis not present

## 2022-11-06 MED ORDER — DOXYCYCLINE HYCLATE 100 MG PO CAPS: 100.0000 mg | ORAL_CAPSULE | Freq: Two times a day (BID) | ORAL | 0 refills | Status: DC

## 2022-11-06 NOTE — Progress Notes (Signed)
   Established Patient Office Visit  Subjective   Patient ID: Wesley Gilmore, male    DOB: 1996/02/27  Age: 27 y.o. MRN: [id number]  Chief Complaint  Patient presents with   Eye Pain    Patient complains of right eye pain, x2 days, Denies any trauma     HPI   Wesley Gilmore seen with swollen right upper lid past couple days.  Denies any injury.  No pruritus.  Mild discomfort.  No eye drainage.  No blurred vision.  No left eye symptoms.  He works with gloves and thinks he may have rubbed his eye at one point. No vesicles or external skin lesions noted.    Past Medical History:  Diagnosis Date   Allergy    Asthma    Childhood   History reviewed. No pertinent surgical history.  reports that he has never smoked. He has never used smokeless tobacco. He reports that he does not drink alcohol and does not use drugs. family history includes Celiac disease in his mother; Hypertension in his maternal aunt and maternal grandmother. No Known Allergies  Review of Systems  Constitutional:  Negative for chills and fever.  Eyes:  Negative for blurred vision, double vision, photophobia and discharge.      Objective:     BP 130/84 (BP Location: Left Arm, Patient Position: Sitting, Cuff Size: Large)   Pulse 70   Temp 98.2 F (36.8 C) (Oral)   Ht 6' 2"$  (1.88 m)   Wt 263 lb 9.6 oz (119.6 kg)   SpO2 100%   BMI 33.84 kg/m    Physical Exam Vitals reviewed.  Constitutional:      Appearance: Normal appearance.  Eyes:     Comments: Conjunctive appears normal bilaterally.  He has mild diffusely swollen right upper eyelid.  Mild erythema.  Pupils equal round reactive to light.  Cornea appears normal.  Cardiovascular:     Rate and Rhythm: Normal rate and regular rhythm.  Neurological:     Mental Status: He is alert.      No results found for any visits on 11/06/22.    The ASCVD Risk score (Arnett DK, et al., 2019) failed to calculate for the following reasons:   The 2019 ASCVD risk  score is only valid for ages 75 to 34    Assessment & Plan:   Right upper eyelid swelling.  Doubt allergic with no pruritus.  No discrete stye or hordeolum.    -Recommend warm compresses several times daily -start Doxycycline 100 mg po bid for 7 days -follow up with primary if not improving over the next couple of weeks.    Carolann Littler, MD

## 2022-11-06 NOTE — Patient Instructions (Signed)
Use warm compresses several times daily.  Follow up for any persistent or worse swelling.

## 2022-11-08 DIAGNOSIS — H01001 Unspecified blepharitis right upper eyelid: Secondary | ICD-10-CM | POA: Diagnosis not present

## 2023-06-06 ENCOUNTER — Ambulatory Visit (INDEPENDENT_AMBULATORY_CARE_PROVIDER_SITE_OTHER): Payer: 59 | Admitting: Nurse Practitioner

## 2023-06-06 ENCOUNTER — Ambulatory Visit: Payer: 59 | Admitting: Nurse Practitioner

## 2023-06-06 ENCOUNTER — Telehealth: Payer: Self-pay | Admitting: Nurse Practitioner

## 2023-06-06 VITALS — BP 142/86 | HR 81 | Temp 98.1°F | Ht 74.0 in | Wt 262.5 lb

## 2023-06-06 DIAGNOSIS — M545 Low back pain, unspecified: Secondary | ICD-10-CM | POA: Diagnosis not present

## 2023-06-06 DIAGNOSIS — R030 Elevated blood-pressure reading, without diagnosis of hypertension: Secondary | ICD-10-CM | POA: Insufficient documentation

## 2023-06-06 DIAGNOSIS — R079 Chest pain, unspecified: Secondary | ICD-10-CM | POA: Diagnosis not present

## 2023-06-06 DIAGNOSIS — I10 Essential (primary) hypertension: Secondary | ICD-10-CM | POA: Insufficient documentation

## 2023-06-06 NOTE — Assessment & Plan Note (Signed)
Acute, no reoccurrence Has very few risk factors for coronary artery disease Most likely this was either muscular or skeletal in origin No significant abnormalities noted on EKG.  Possible early signs of LVH starting.  See plan under elevated blood pressure for further recommendations. Offered referral to cardiology for evaluation, but after shared decision making discussion patient will hold off and notify PCP if chest pain returns at which point he would be open to referral.

## 2023-06-06 NOTE — Progress Notes (Signed)
Established Patient Office Visit  Subjective   Patient ID: Wesley Gilmore, male    DOB: 01/30/1996  Age: 27 y.o. MRN: [id number]  Chief Complaint  Patient presents with   Chest Pain    Happened on Monday, first time, sharp pain then to throbbing. Pt stated the time frame is lasted was about 3 min. Back pain has been going on for more than a year, off and on.     Patient arrives today for acute visit for the above.  He reports he had an episode of chest pain earlier this week that was described as sharp and throbbing.  It lasted for about 3 minutes then resolved.  He is concerned about possible etiology of this.  He reports no known family history of cardiac disease, no personal known history of cardiac disease, he is a never smoker, has had borderline elevated blood pressure in the past but has not been diagnosed with hypertension.  He has not had any reoccurrence of the chest pain despite being physically active at his job.  He also has intermittent severe low back pain.  Reports that this has been ongoing intermittently for at least 2 years.  He has been seen in the emergency department in the past for this.  He reports that he will treat this as needed with either Tylenol or methocarbamol.  His work includes lifting heavy objects frequently.  He denies any history or current weakness, numbness to lower extremities or new bowel bladder incontinence.  He is requesting FMLA paperwork be filled out for intermittently as needed for his back pain.    Review of Systems  Respiratory:  Negative for shortness of breath.   Cardiovascular:  Positive for chest pain and palpitations.  Neurological:  Negative for dizziness and headaches.      Objective:     BP (!) 142/86   Pulse 81   Temp 98.1 F (36.7 C) (Temporal)   Ht 6\' 2"  (1.88 m)   Wt 262 lb 8 oz (119.1 kg)   SpO2 98%   BMI 33.70 kg/m  BP Readings from Last 3 Encounters:  06/06/23 (!) 142/86  11/06/22 130/84  05/22/21 136/82    Wt Readings from Last 3 Encounters:  06/06/23 262 lb 8 oz (119.1 kg)  11/06/22 263 lb 9.6 oz (119.6 kg)  05/22/21 249 lb (112.9 kg)      Physical Exam Vitals reviewed.  Constitutional:      Appearance: Normal appearance.  HENT:     Head: Normocephalic and atraumatic.  Cardiovascular:     Rate and Rhythm: Normal rate and regular rhythm.  Pulmonary:     Effort: Pulmonary effort is normal.     Breath sounds: Normal breath sounds.  Musculoskeletal:     Cervical back: Neck supple.  Skin:    General: Skin is warm and dry.  Neurological:     Mental Status: He is alert and oriented to person, place, and time.  Psychiatric:        Mood and Affect: Mood normal.        Behavior: Behavior normal.        Thought Content: Thought content normal.        Judgment: Judgment normal.    EKG: Normal sinus rhythm with nonspecific T wave abnormality.  EKG reviewed with backup supervising physician Dr. Yetta Barre  No results found for any visits on 06/06/23.    The ASCVD Risk score (Arnett DK, et al., 2019) failed to calculate for the  following reasons:   The 2019 ASCVD risk score is only valid for ages 35 to 25    Assessment & Plan:   Problem List Items Addressed This Visit       Other   Chest pain - Primary    Acute, no reoccurrence Has very few risk factors for coronary artery disease Most likely this was either muscular or skeletal in origin No significant abnormalities noted on EKG.  Possible early signs of LVH starting.  See plan under elevated blood pressure for further recommendations. Offered referral to cardiology for evaluation, but after shared decision making discussion patient will hold off and notify PCP if chest pain returns at which point he would be open to referral.      Relevant Orders   EKG 12-Lead   Bilateral low back pain    Chronic, intermittent Probably flares related to muscle strain Treat with as needed Tylenol and methocarbamol Patient told to proceed  to the emergency department if he experiences acute onset of lower extremity weakness, numbness, and/or bowel/bladder incontinence.  He reports his understanding Will fill out FMLA paperwork.      Elevated blood pressure reading    No diagnosis of hypertension has been made thus far We did discuss EKG findings Offered starting him on low-dose of amlodipine, patient would prefer to focus on lifestyle modification Discussed Dash/Mediterranean type diet and exercise recommendations Patient to follow-up in 3 months with PCP to recheck blood pressure       Return in about 3 months (around 09/05/2023) for F/U PCP.    Elenore Paddy, NP

## 2023-06-06 NOTE — Telephone Encounter (Signed)
Please call patient and schedule for virtual visit with me to discuss his FMLA paperwork.  The top portion was not completed so I need to clarify some things before I am able to complete it.  Thank you.

## 2023-06-06 NOTE — Assessment & Plan Note (Signed)
Chronic, intermittent Probably flares related to muscle strain Treat with as needed Tylenol and methocarbamol Patient told to proceed to the emergency department if he experiences acute onset of lower extremity weakness, numbness, and/or bowel/bladder incontinence.  He reports his understanding Will fill out FMLA paperwork.

## 2023-06-06 NOTE — Assessment & Plan Note (Signed)
No diagnosis of hypertension has been made thus far We did discuss EKG findings Offered starting him on low-dose of amlodipine, patient would prefer to focus on lifestyle modification Discussed Dash/Mediterranean type diet and exercise recommendations Patient to follow-up in 3 months with PCP to recheck blood pressure

## 2023-06-09 NOTE — Telephone Encounter (Signed)
Left voice mail for pt to call back to schedule an visit with Jiles Prows, NP

## 2023-06-12 ENCOUNTER — Telehealth (INDEPENDENT_AMBULATORY_CARE_PROVIDER_SITE_OTHER): Payer: 59 | Admitting: Nurse Practitioner

## 2023-06-12 DIAGNOSIS — M545 Low back pain, unspecified: Secondary | ICD-10-CM | POA: Diagnosis not present

## 2023-06-12 DIAGNOSIS — Z0279 Encounter for issue of other medical certificate: Secondary | ICD-10-CM

## 2023-06-12 NOTE — Assessment & Plan Note (Signed)
Chronic, intermittent Discussed FMLA paperwork with Dr. Lawerance Bach as well as patient's job description.  I am agreeable to filling out FMLA paperwork through the next 6 months for him to be able to take intermittently as needed.  If he does have to leave/miss work due to the back pain he was instructed to notify myself or Dr. Lawerance Bach (his PCP) to discuss referral to specialist for assistance with management.

## 2023-06-12 NOTE — Progress Notes (Signed)
Established Patient Office Visit  An audio/visual tele-health visit was completed today for this patient. I connected with  Wesley Gilmore on 06/12/23 utilizing audio/visual technology and verified that I am speaking with the correct person using two identifiers. The patient was located at their home, and I was located at the office of [facility] at [facility] during the encounter. I discussed the limitations of evaluation and management by telemedicine. The patient expressed understanding and agreed to proceed.     Subjective   Patient ID: Wesley Gilmore, male    DOB: 1995/12/15  Age: 27 y.o. MRN: [id number]  Chief Complaint  Patient presents with   FMLA paperwork    Patient arrives today for virtual visit.  He needs FMLA paperwork completed for intermittent leave from his job as needed for back pain flareups.  He is here today because I had a couple questions regarding his job description so FMLA paperwork could be completed.  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  He also takes Tylenol or methocarbamol as needed for pain.  He should not operate heavy machinery if he needs to take a dose of methocarbamol for his back pain.    ROS: see HPI    Objective:     There were no vitals taken for this visit.   Physical Exam Comprehensive physical exam not completed today as office visit was conducted remotely.  Patient appears well over video, no acute distress noted.  Patient was alert and oriented, and appeared to have appropriate judgment.    No results found for any visits on 06/12/23.    The ASCVD Risk score (Arnett DK, et al., 2019) failed to calculate for the following reasons:   The 2019 ASCVD risk score is only valid for ages 80 to 1    Assessment & Plan:   Problem List Items Addressed This Visit       Other   Bilateral low back pain - Primary    Chronic, intermittent Discussed FMLA paperwork with Dr.  Lawerance Bach as well as patient's job description.  I am agreeable to filling out FMLA paperwork through the next 6 months for him to be able to take intermittently as needed.  If he does have to leave/miss work due to the back pain he was instructed to notify myself or Dr. Lawerance Bach (his PCP) to discuss referral to specialist for assistance with management.       No follow-ups on file.    Wesley Paddy, NP

## 2023-06-13 ENCOUNTER — Telehealth: Payer: Self-pay | Admitting: Internal Medicine

## 2023-06-13 NOTE — Telephone Encounter (Signed)
Pt called checking on status on his FMLA paper work. Please advise.

## 2023-06-17 NOTE — Telephone Encounter (Signed)
Patient called back to check on fmla paperwork again.  Please call [phone]

## 2023-06-20 NOTE — Telephone Encounter (Signed)
Patient's grandmother came and picked up forms.

## 2023-06-20 NOTE — Telephone Encounter (Signed)
Called pt and left detail message for pt to call or dropped by to pick up his forms. Also send my chart message to pt

## 2023-08-31 NOTE — Progress Notes (Deleted)
      Subjective:    Patient ID: Wesley Gilmore, male    DOB: 1996-07-17, 27 y.o.   MRN: [id number]     HPI [name] is here for follow up of his chronic medical problems.  Saw Wesley Gilmore 3 months ago - has chronic back pain  > 1 year with intermittent flareups.  He was given FMLA 3 months ago for this.  He is a Estate agent.  He takes methocarbamol and tylenol as needed for pain.    Medications and allergies reviewed with patient and updated if appropriate.  No current outpatient medications on file prior to visit.   No current facility-administered medications on file prior to visit.     Review of Systems     Objective:  There were no vitals filed for this visit. BP Readings from Last 3 Encounters:  06/06/23 (!) 142/86  11/06/22 130/84  05/22/21 136/82   Wt Readings from Last 3 Encounters:  06/06/23 262 lb 8 oz (119.1 kg)  11/06/22 263 lb 9.6 oz (119.6 kg)  05/22/21 249 lb (112.9 kg)   There is no height or weight on file to calculate BMI.    Physical Exam     Lab Results  Component Value Date   ALT 33 01/22/2018   AST 23 01/22/2018     Assessment & Plan:    See Problem List for Assessment and Plan of chronic medical problems.

## 2023-09-04 ENCOUNTER — Encounter: Payer: Self-pay | Admitting: Internal Medicine

## 2023-09-05 ENCOUNTER — Ambulatory Visit: Payer: 59 | Admitting: Internal Medicine

## 2023-09-05 DIAGNOSIS — G8929 Other chronic pain: Secondary | ICD-10-CM

## 2023-10-31 ENCOUNTER — Encounter: Payer: Self-pay | Admitting: Family Medicine

## 2023-10-31 ENCOUNTER — Ambulatory Visit (INDEPENDENT_AMBULATORY_CARE_PROVIDER_SITE_OTHER): Payer: 59 | Admitting: Family Medicine

## 2023-10-31 VITALS — BP 132/92 | HR 93 | Temp 99.1°F | Ht 74.0 in | Wt 249.4 lb

## 2023-10-31 DIAGNOSIS — R030 Elevated blood-pressure reading, without diagnosis of hypertension: Secondary | ICD-10-CM | POA: Diagnosis not present

## 2023-10-31 LAB — CBC WITH DIFFERENTIAL/PLATELET
Basophils Absolute: 0 10*3/uL (ref 0.0–0.1)
Basophils Relative: 0.5 % (ref 0.0–3.0)
Eosinophils Absolute: 0.1 10*3/uL (ref 0.0–0.7)
Eosinophils Relative: 3.3 % (ref 0.0–5.0)
HCT: 48.9 % (ref 39.0–52.0)
Hemoglobin: 15.8 g/dL (ref 13.0–17.0)
Lymphocytes Relative: 33.2 % (ref 12.0–46.0)
Lymphs Abs: 1.2 10*3/uL (ref 0.7–4.0)
MCHC: 32.3 g/dL (ref 30.0–36.0)
MCV: 85.1 fL (ref 78.0–100.0)
Monocytes Absolute: 0.7 10*3/uL (ref 0.1–1.0)
Monocytes Relative: 18.7 % — ABNORMAL HIGH (ref 3.0–12.0)
Neutro Abs: 1.7 10*3/uL (ref 1.4–7.7)
Neutrophils Relative %: 44.3 % (ref 43.0–77.0)
Platelets: 268 10*3/uL (ref 150.0–400.0)
RBC: 5.74 Mil/uL (ref 4.22–5.81)
RDW: 14.6 % (ref 11.5–15.5)
WBC: 3.7 10*3/uL — ABNORMAL LOW (ref 4.0–10.5)

## 2023-10-31 LAB — COMPREHENSIVE METABOLIC PANEL
ALT: 79 U/L — ABNORMAL HIGH (ref 0–53)
AST: 103 U/L — ABNORMAL HIGH (ref 0–37)
Albumin: 4 g/dL (ref 3.5–5.2)
Alkaline Phosphatase: 39 U/L (ref 39–117)
BUN: 10 mg/dL (ref 6–23)
CO2: 30 meq/L (ref 19–32)
Calcium: 9.4 mg/dL (ref 8.4–10.5)
Chloride: 102 meq/L (ref 96–112)
Creatinine, Ser: 0.91 mg/dL (ref 0.40–1.50)
GFR: 117.01 mL/min (ref >60.00)
Glucose, Bld: 92 mg/dL (ref 70–99)
Potassium: 4.4 meq/L (ref 3.5–5.1)
Sodium: 138 meq/L (ref 135–145)
Total Bilirubin: 0.5 mg/dL (ref 0.2–1.2)
Total Protein: 8.5 g/dL — ABNORMAL HIGH (ref 6.0–8.3)

## 2023-10-31 NOTE — Progress Notes (Deleted)
   Acute Office Visit  Subjective:     Patient ID: JOSEF TOURIGNY, male    DOB: November 12, 1995, 28 y.o.   MRN: [id number]  No chief complaint on file.   HPI Patient is in today for ***  ROS      Objective:    There were no vitals taken for this visit. {Vitals History (Optional):23777}  Physical Exam Vitals and nursing note reviewed.  Constitutional:      Appearance: Normal appearance.  HENT:     Head: Normocephalic and atraumatic.  Eyes:     Extraocular Movements: Extraocular movements intact.  Cardiovascular:     Rate and Rhythm: Normal rate and regular rhythm.     Pulses: Normal pulses.     Heart sounds: Normal heart sounds.  Pulmonary:     Effort: Pulmonary effort is normal.     Breath sounds: Normal breath sounds.  Musculoskeletal:        General: Normal range of motion.     Cervical back: Normal range of motion.  Skin:    General: Skin is warm and dry.  Neurological:     General: No focal deficit present.     Mental Status: He is alert and oriented to person, place, and time.  Psychiatric:        Mood and Affect: Mood normal.        Behavior: Behavior normal.   No results found for any visits on 10/31/23.      Assessment & Plan:   Problem List Items Addressed This Visit   None   No orders of the defined types were placed in this encounter.   No follow-ups on file.  Moshe Cipro, FNP

## 2023-10-31 NOTE — Progress Notes (Signed)
   Acute Office Visit  Subjective:     Patient ID: Wesley Gilmore, male    DOB: 12/01/1995, 28 y.o.   MRN: [id number]  Chief Complaint  Patient presents with   Hypertension    Blood pressure check for work. Patient had recently performed DOT physical (this last Monday-Tuesday) and was informed that blood pressure was high. Also informed of vision issues that they will be following up on with an ophthalmologist    Hypertension   Patient is in today for evaluation of BP. Reports elevated BP at recent DOT physical, cannot recall value. Denies previous symptoms, HA, SOB, vision changes, chest pain, palpitations, edema. Fasting today. Denies other concerns.  ROS Per HPI      Objective:    BP (!) 132/92 (BP Location: Left Arm, Patient Position: Sitting)   Pulse 93   Temp 99.1 F (37.3 C)   Ht 6\' 2"  (1.88 m)   Wt 249 lb 6.4 oz (113.1 kg)   SpO2 99%   BMI 32.02 kg/m    Physical Exam Vitals and nursing note reviewed.  Constitutional:      General: He is not in acute distress.    Appearance: Normal appearance.  HENT:     Head: Normocephalic and atraumatic.  Eyes:     Extraocular Movements: Extraocular movements intact.  Cardiovascular:     Rate and Rhythm: Normal rate and regular rhythm.     Pulses: Normal pulses.     Heart sounds: Normal heart sounds. No murmur heard. Pulmonary:     Effort: Pulmonary effort is normal. No respiratory distress.     Breath sounds: Normal breath sounds. No wheezing, rhonchi or rales.  Musculoskeletal:        General: Normal range of motion.     Cervical back: Normal range of motion.     Right lower leg: No edema.     Left lower leg: No edema.  Lymphadenopathy:     Cervical: No cervical adenopathy.  Skin:    General: Skin is warm and dry.  Neurological:     General: No focal deficit present.     Mental Status: He is alert and oriented to person, place, and time.  Psychiatric:        Mood and Affect: Mood normal.         Behavior: Behavior normal.     No results found for any visits on 10/31/23.      Assessment & Plan:  1. Elevated blood pressure reading (Primary)  - CBC with Differential/Platelet - Comprehensive metabolic panel   No orders of the defined types were placed in this encounter.  Check BP at home once daily in the mornings for the next month. Follow up with me in a month to recheck blood pressures.  Return if symptoms worsen or fail to improve.  Moshe Cipro, FNP

## 2023-10-31 NOTE — Patient Instructions (Signed)
We are checking labs today, will be in contact with any results that require further attention  Check BP at home once daily in the mornings for the next month. Follow up with me in a month to recheck blood pressures.  Send me BP readings over the next 2 weeks and we will decide from there if you will need medications or not.   Follow-up with me for new or worsening symptoms.

## 2023-11-03 ENCOUNTER — Other Ambulatory Visit: Payer: Self-pay | Admitting: Family Medicine

## 2023-11-03 DIAGNOSIS — R7401 Elevation of levels of liver transaminase levels: Secondary | ICD-10-CM

## 2023-11-03 DIAGNOSIS — D72818 Other decreased white blood cell count: Secondary | ICD-10-CM

## 2023-11-05 NOTE — Progress Notes (Unsigned)
Subjective:    Patient ID: Wesley Gilmore, male    DOB: 11-02-1995, 28 y.o.   MRN: [id number]     HPI Jonavon is here for follow up of his chronic medical problems.  Elevated BP-he was seen last week and his blood pressure was elevated.  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  He does not check his blood pressure at home.  Has been making changes in diet and trying to eat out levels.  He is exercising some, but could do more.  Alcohol intake-he drinks 2/month when he goes out.  He stops when he starts to feel it - 5 drinks max.  His grandmother is here with him and is concerned about his drinking  Labs from last week showed elevated LFTs - did not drink any alcohol that day or for few days prior.  No illness or medication that he definitely had may caused the elevation.  He denies any abdominal pain, change in bowels.   Work is exercise and plays basketball  He has 2 jobs.  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  It is very cold and there is fingers and if turning black because of the cold.  He wanted to get a note so that he was not in the freezer all day which can be up to 12 hours.   Medications and allergies reviewed with patient and updated if appropriate.  No current outpatient medications on file prior to visit.   No current facility-administered medications on file prior to visit.     Review of Systems  Constitutional:  Negative for fever.  Respiratory:  Negative for cough, shortness of breath and wheezing.   Cardiovascular:  Negative for chest pain, palpitations and leg swelling.  Gastrointestinal:  Negative for abdominal pain, blood in stool, constipation, diarrhea and nausea.       No gerd  Neurological:  Negative for light-headedness and headaches.       Objective:   Vitals:   11/07/23 0924  BP: (!) 138/92  Pulse: 90  Temp: 98.6 F (37 C)  SpO2: 97%   BP Readings  from Last 3 Encounters:  11/07/23 (!) 138/92  10/31/23 (!) 132/92  06/06/23 (!) 142/86   Wt Readings from Last 3 Encounters:  11/07/23 248 lb (112.5 kg)  10/31/23 249 lb 6.4 oz (113.1 kg)  06/06/23 262 lb 8 oz (119.1 kg)   Body mass index is 31.84 kg/m.    Physical Exam Constitutional:      General: He is not in acute distress.    Appearance: Normal appearance. He is not ill-appearing.  HENT:     Head: Normocephalic and atraumatic.  Eyes:     Conjunctiva/sclera: Conjunctivae normal.  Cardiovascular:     Rate and Rhythm: Normal rate and regular rhythm.     Heart sounds: Normal heart sounds.  Pulmonary:     Effort: Pulmonary effort is normal. No respiratory distress.     Breath sounds: Normal breath sounds. No wheezing or rales.  Abdominal:     General: There is no distension.     Palpations: Abdomen is soft.     Tenderness: There is no abdominal tenderness.  Musculoskeletal:     Right lower leg: No edema.     Left lower leg: No edema.  Skin:    General: Skin is warm and dry.  Findings: No rash.  Neurological:     Mental Status: He is alert. Mental status is at baseline.  Psychiatric:        Mood and Affect: Mood normal.        Lab Results  Component Value Date   WBC 3.7 (L) 10/31/2023   HGB 15.8 10/31/2023   HCT 48.9 10/31/2023   PLT 268.0 10/31/2023   GLUCOSE 92 10/31/2023   ALT 79 (H) 10/31/2023   AST 103 (H) 10/31/2023   NA 138 10/31/2023   K 4.4 10/31/2023   CL 102 10/31/2023   CREATININE 0.91 10/31/2023   BUN 10 10/31/2023   CO2 30 10/31/2023     Assessment & Plan:    See Problem List for Assessment and Plan of chronic medical problems.

## 2023-11-05 NOTE — Patient Instructions (Incomplete)
      Blood work was ordered.       Medications changes include :   None    A referral was ordered and someone will call you to schedule an appointment.     No follow-ups on file.

## 2023-11-06 DIAGNOSIS — H5213 Myopia, bilateral: Secondary | ICD-10-CM | POA: Diagnosis not present

## 2023-11-07 ENCOUNTER — Ambulatory Visit (INDEPENDENT_AMBULATORY_CARE_PROVIDER_SITE_OTHER): Payer: 59 | Admitting: Internal Medicine

## 2023-11-07 ENCOUNTER — Encounter: Payer: Self-pay | Admitting: Internal Medicine

## 2023-11-07 VITALS — BP 136/80 | HR 90 | Temp 98.6°F | Ht 74.0 in | Wt 248.0 lb

## 2023-11-07 DIAGNOSIS — I10 Essential (primary) hypertension: Secondary | ICD-10-CM | POA: Diagnosis not present

## 2023-11-07 DIAGNOSIS — R7989 Other specified abnormal findings of blood chemistry: Secondary | ICD-10-CM

## 2023-11-07 DIAGNOSIS — M545 Low back pain, unspecified: Secondary | ICD-10-CM

## 2023-11-07 LAB — HEPATIC FUNCTION PANEL
ALT: 89 U/L — ABNORMAL HIGH (ref 0–53)
AST: 154 U/L — ABNORMAL HIGH (ref 0–37)
Albumin: 4 g/dL (ref 3.5–5.2)
Alkaline Phosphatase: 35 U/L — ABNORMAL LOW (ref 39–117)
Bilirubin, Direct: 0 mg/dL (ref 0.0–0.3)
Total Bilirubin: 0.4 mg/dL (ref 0.2–1.2)
Total Protein: 8.4 g/dL — ABNORMAL HIGH (ref 6.0–8.3)

## 2023-11-07 MED ORDER — AMLODIPINE BESYLATE 5 MG PO TABS: 5.0000 mg | ORAL_TABLET | Freq: Every day | ORAL | 5 refills | Status: DC

## 2023-11-07 NOTE — Assessment & Plan Note (Signed)
New Blood pressure has been persistently elevated over goal of less than 130/80 Start amlodipine 5 mg daily Advise low-sodium diet, increased exercise and weight loss Encouraged monitoring BP at home

## 2023-11-07 NOTE — Assessment & Plan Note (Addendum)
New Has not had blood work done in a while, but last week did have blood work done and his liver tests were elevated No obvious cause by history Asymptomatic Check hepatic function, hepatitis panel ordered previously and can be done today Encouraged healthy diet and weight loss May need ultrasound pending on repeat blood work

## 2023-11-08 NOTE — Addendum Note (Signed)
 Addended by: Pincus Sanes on: 11/08/2023 09:13 AM   Modules accepted: Orders

## 2023-11-25 ENCOUNTER — Other Ambulatory Visit

## 2023-12-04 ENCOUNTER — Encounter: Payer: Self-pay | Admitting: Internal Medicine

## 2023-12-04 NOTE — Progress Notes (Signed)
      Subjective:    Patient ID: Wesley Gilmore, male    DOB: [birth year], 28 y.o.   MRN: [id number]     HPI Wesley Gilmore is here for follow up of his chronic medical problems.  Started amlodipine  5 mg daily 4 weeks ago.    Medications and allergies reviewed with patient and updated if appropriate.  Current Outpatient Medications on File Prior to Visit  Medication Sig Dispense Refill  . amLODipine  (NORVASC ) 5 MG tablet Take 1 tablet (5 mg total) by mouth daily. 30 tablet 5   No current facility-administered medications on file prior to visit.     Review of Systems     Objective:  There were no vitals filed for this visit. BP Readings from Last 3 Encounters:  11/07/23 136/80  10/31/23 (!) 132/92  06/06/23 (!) 142/86   Wt Readings from Last 3 Encounters:  11/07/23 248 lb (112.5 kg)  10/31/23 249 lb 6.4 oz (113.1 kg)  06/06/23 262 lb 8 oz (119.1 kg)   There is no height or weight on file to calculate BMI.    Physical Exam     Lab Results  Component Value Date   WBC 3.7 (L) 10/31/2023   HGB 15.8 10/31/2023   HCT 48.9 10/31/2023   PLT 268.0 10/31/2023   GLUCOSE 92 10/31/2023   ALT 89 (H) 11/07/2023   AST 154 (H) 11/07/2023   NA 138 10/31/2023   K 4.4 10/31/2023   CL 102 10/31/2023   CREATININE 0.91 10/31/2023   BUN 10 10/31/2023   CO2 30 10/31/2023     Assessment & Plan:    See Problem List for Assessment and Plan of chronic medical problems.    This encounter was created in error - please disregard.

## 2023-12-04 NOTE — Patient Instructions (Addendum)
      Blood work was ordered.       Medications changes include :   None      Return in about 6 months (around 06/06/2024) for Physical Exam.

## 2023-12-05 ENCOUNTER — Encounter: Payer: 59 | Admitting: Internal Medicine

## 2023-12-05 DIAGNOSIS — R7989 Other specified abnormal findings of blood chemistry: Secondary | ICD-10-CM

## 2023-12-05 DIAGNOSIS — I10 Essential (primary) hypertension: Secondary | ICD-10-CM

## 2024-02-02 ENCOUNTER — Ambulatory Visit: Admitting: Family Medicine

## 2024-02-02 ENCOUNTER — Encounter: Payer: Self-pay | Admitting: Family Medicine

## 2024-02-02 VITALS — BP 138/88 | HR 89 | Temp 98.1°F | Resp 18 | Ht 74.0 in | Wt 252.0 lb

## 2024-02-02 DIAGNOSIS — L03011 Cellulitis of right finger: Secondary | ICD-10-CM | POA: Diagnosis not present

## 2024-02-02 MED ORDER — CEPHALEXIN 500 MG PO CAPS: 500.0000 mg | ORAL_CAPSULE | Freq: Two times a day (BID) | ORAL | 0 refills | Status: AC

## 2024-02-02 NOTE — Progress Notes (Signed)
   Assessment & Plan:  1. Paronychia of finger of right hand (Primary) Education provided on paronychia.  Encouraged to continue soaking hand and promoting drainage. - cephALEXin  (KEFLEX ) 500 MG capsule; Take 1 capsule (500 mg total) by mouth 2 (two) times daily for 7 days.  Dispense: 14 capsule; Refill: 0   Follow up plan: Return if symptoms worsen or fail to improve.  Wesley Los, MSN, APRN, FNP-C  Subjective:  HPI: Wesley Gilmore is a 28 y.o. male presenting on 02/02/2024 for Hand Pain (Middle finger on right hand- pain and swelling x2 days. Has been soaking in Hibiclens, peroxide and alcohol. Noticed finger drained while soaking. )  Patient reports pain and swelling of his middle finger on the right hand x 2 days.  He thinks he cut his nails back too far.  He has been soaking his finger in Hibiclens, peroxide, and alcohol which has promoted drainage.   ROS: Negative unless specifically indicated above in HPI.   Relevant past medical history reviewed and updated as indicated.   Allergies and medications reviewed and updated.   Current Outpatient Medications:    amLODipine  (NORVASC ) 5 MG tablet, Take 1 tablet (5 mg total) by mouth daily. (Patient not taking: Reported on 02/02/2024), Disp: 30 tablet, Rfl: 5  No Known Allergies  Objective:   BP 138/88   Pulse 89   Temp 98.1 F (36.7 C)   Resp 18   Ht 6\' 2"  (1.88 m)   Wt 252 lb (114.3 kg)   SpO2 98%   BMI 32.35 kg/m    Physical Exam Vitals reviewed.  Constitutional:      General: He is not in acute distress.    Appearance: Normal appearance. He is not ill-appearing, toxic-appearing or diaphoretic.  HENT:     Head: Normocephalic and atraumatic.  Eyes:     General: No scleral icterus.       Right eye: No discharge.        Left eye: No discharge.     Conjunctiva/sclera: Conjunctivae normal.  Cardiovascular:     Rate and Rhythm: Normal rate.  Pulmonary:     Effort: Pulmonary effort is normal. No respiratory  distress.  Musculoskeletal:        General: Normal range of motion.     Cervical back: Normal range of motion.  Skin:    General: Skin is warm and dry.     Comments: Erythema and swelling to the middle finger on the right hand with purulence under the skin on the medial side of the nail.  Neurological:     Mental Status: He is alert and oriented to person, place, and time. Mental status is at baseline.  Psychiatric:        Mood and Affect: Mood normal.        Behavior: Behavior normal.        Thought Content: Thought content normal.        Judgment: Judgment normal.

## 2024-05-08 ENCOUNTER — Other Ambulatory Visit: Payer: Self-pay | Admitting: Internal Medicine
